# Patient Record
Sex: Male | Born: 1980 | Hispanic: Yes | Marital: Married | Smoking: Never smoker
Health system: Southern US, Community
[De-identification: ages and names within clinical notes are randomized; demographics above are authoritative.]

---

## 2009-07-16 ENCOUNTER — Ambulatory Visit: Payer: Self-pay | Admitting: Internal Medicine

## 2009-07-29 ENCOUNTER — Emergency Department: Payer: Self-pay | Admitting: Emergency Medicine

## 2009-07-30 ENCOUNTER — Ambulatory Visit: Payer: Self-pay | Admitting: Internal Medicine

## 2009-08-15 ENCOUNTER — Ambulatory Visit: Payer: Self-pay | Admitting: Internal Medicine

## 2009-09-15 ENCOUNTER — Ambulatory Visit: Payer: Self-pay | Admitting: Internal Medicine

## 2019-04-24 ENCOUNTER — Emergency Department
Admission: EM | Admit: 2019-04-24 | Discharge: 2019-04-24 | Disposition: A | Discharge status: Home / Self Care | Payer: Self-pay | Attending: Emergency Medicine | Admitting: Emergency Medicine

## 2019-04-24 ENCOUNTER — Other Ambulatory Visit: Payer: Self-pay

## 2019-04-24 ENCOUNTER — Encounter: Payer: Self-pay | Admitting: *Deleted

## 2019-04-24 DIAGNOSIS — R6883 Chills (without fever): Secondary | ICD-10-CM | POA: Insufficient documentation

## 2019-04-24 DIAGNOSIS — R110 Nausea: Secondary | ICD-10-CM | POA: Insufficient documentation

## 2019-04-24 LAB — CBC
HCT: 41.3 % (ref 39.0–52.0)
Hemoglobin: 13.7 g/dL (ref 13.0–17.0)
MCH: 29.4 pg (ref 26.0–34.0)
MCHC: 33.2 g/dL (ref 30.0–36.0)
MCV: 88.6 fL (ref 80.0–100.0)
Platelets: 83 10*3/uL — ABNORMAL LOW (ref 150–400)
RBC: 4.66 MIL/uL (ref 4.22–5.81)
RDW: 12.9 % (ref 11.5–15.5)
WBC: 5.8 10*3/uL (ref 4.0–10.5)
nRBC: 0 % (ref 0.0–0.2)

## 2019-04-24 LAB — COMPREHENSIVE METABOLIC PANEL
ALT: 27 U/L (ref 0–44)
AST: 29 U/L (ref 15–41)
Albumin: 4.4 g/dL (ref 3.5–5.0)
Alkaline Phosphatase: 51 U/L (ref 38–126)
Anion gap: 5 (ref 5–15)
BUN: 18 mg/dL (ref 6–20)
CO2: 29 mmol/L (ref 22–32)
Calcium: 8.9 mg/dL (ref 8.9–10.3)
Chloride: 101 mmol/L (ref 98–111)
Creatinine, Ser: 1.08 mg/dL (ref 0.61–1.24)
GFR calc Af Amer: >60 mL/min (ref >60)
GFR calc non Af Amer: >60 mL/min (ref >60)
Glucose, Bld: 202 mg/dL — ABNORMAL HIGH (ref 70–99)
Potassium: 3.8 mmol/L (ref 3.5–5.1)
Sodium: 135 mmol/L (ref 135–145)
Total Bilirubin: 0.5 mg/dL (ref 0.3–1.2)
Total Protein: 7.4 g/dL (ref 6.5–8.1)

## 2019-04-24 LAB — URINALYSIS, COMPLETE (UACMP) WITH MICROSCOPIC
Bacteria, UA: NONE SEEN
Bilirubin Urine: NEGATIVE
Glucose, UA: 150 mg/dL — AB
Hgb urine dipstick: NEGATIVE
Ketones, ur: NEGATIVE mg/dL
Leukocytes,Ua: NEGATIVE
Nitrite: NEGATIVE
Protein, ur: NEGATIVE mg/dL
Specific Gravity, Urine: 1.006 (ref 1.005–1.030)
Squamous Epithelial / HPF: NONE SEEN (ref 0–5)
pH: 6 (ref 5.0–8.0)

## 2019-04-24 LAB — LIPASE, BLOOD: Lipase: 32 U/L (ref 11–51)

## 2019-04-24 MED ORDER — LACTATED RINGERS IV BOLUS
1000.0000 mL | Freq: Once | INTRAVENOUS | Status: AC
  Administered 2019-04-24: 1000 mL via INTRAVENOUS

## 2019-04-24 MED ORDER — ONDANSETRON HCL 4 MG/2ML IJ SOLN
4.0000 mg | Freq: Once | INTRAMUSCULAR | Status: AC
  Administered 2019-04-24: 4 mg via INTRAVENOUS
  Filled 2019-04-24: qty 2

## 2019-04-24 MED ORDER — ONDANSETRON 4 MG PO TBDP: 4.0000 mg | ORAL_TABLET | Freq: Three times a day (TID) | ORAL | 0 refills | Status: DC | PRN

## 2019-04-24 MED ORDER — SODIUM CHLORIDE 0.9% FLUSH: 3.0000 mL | Freq: Once | INTRAVENOUS | Status: DC

## 2019-04-24 NOTE — ED Provider Notes (Signed)
[facility] Emergency Department Provider Note   ____________________________________________   First MD Initiated Contact with Patient 04/24/19 2010     (approximate)  I have reviewed the triage vital signs and the nursing notes.   HISTORY  Chief Complaint Chills and Nausea    HPI Dale Collins is a 39 y.o. male no significant past history presents to the ED complaining of chills and nausea.  Patient reports that he has been feeling badly for the past 2 days, ever since he spent most of the day working on his car and not drinking much water.  He has had occasional chills along with generalized weakness and nausea.  He denies any associated abdominal pain or diarrhea, has not taken his temperature at home or take any medications for his symptoms.  He has been able to drink Gatorade at times, but has had a hard time tolerating solids.  He denies any sick contacts and has not had any fevers, cough, chest pain, or shortness of breath.        History reviewed. No pertinent past medical history.  There are no problems to display for this patient.   History reviewed. No pertinent surgical history.  Prior to Admission medications   Medication Sig Start Date End Date Taking? Authorizing Provider  ondansetron (ZOFRAN ODT) 4 MG disintegrating tablet Take 1 tablet (4 mg total) by mouth every 8 (eight) hours as needed for nausea or vomiting. 04/24/19   Chesley Noon, MD    Allergies Omnicef [cefdinir]  No family history on file.  Social History Social History   Tobacco Use  . Smoking status: Never Smoker  . Smokeless tobacco: Never Used  Substance Use Topics  . Alcohol use: Not Currently  . Drug use: Not Currently    Review of Systems  Constitutional: Negative for fever, positive for chills. Eyes: No visual changes. ENT: No sore throat. Cardiovascular: Denies chest pain. Respiratory: Denies shortness of breath. Gastrointestinal: No abdominal  pain.  Positive for nausea, no vomiting.  No diarrhea.  No constipation. Genitourinary: Negative for dysuria. Musculoskeletal: Negative for back pain. Skin: Negative for rash. Neurological: Negative for headaches, focal weakness or numbness.  ____________________________________________   PHYSICAL EXAM:  VITAL SIGNS: ED Triage Vitals [04/24/19 1950]  Enc Vitals Group     BP (!) 140/59     Pulse Rate 77     Resp 18     Temp 97.9 F (36.6 C)     Temp Source Oral     SpO2 97 %     Weight 175 lb (79.4 kg)     Height 5\' 11"  (1.803 m)     Head Circumference      Peak Flow      Pain Score      Pain Loc      Pain Edu?      Excl. in GC?     Constitutional: Alert and oriented. Eyes: Conjunctivae are normal. Head: Atraumatic. Nose: No congestion/rhinnorhea. Mouth/Throat: Mucous membranes are moist. Neck: Normal ROM Cardiovascular: Normal rate, regular rhythm. Grossly normal heart sounds. Respiratory: Normal respiratory effort.  No retractions. Lungs CTAB. Gastrointestinal: Soft and nontender. No distention. Genitourinary: deferred Musculoskeletal: No lower extremity tenderness nor edema. Neurologic:  Normal speech and language. No gross focal neurologic deficits are appreciated. Skin:  Skin is warm, dry and intact. No rash noted. Psychiatric: Mood and affect are normal. Speech and behavior are normal.  ____________________________________________   LABS (all labs ordered are listed, but only abnormal  results are displayed)  Labs Reviewed  COMPREHENSIVE METABOLIC PANEL - Abnormal; Notable for the following components:      Result Value   Glucose, Bld 202 (*)    All other components within normal limits  CBC - Abnormal; Notable for the following components:   Platelets 83 (*)    All other components within normal limits  URINALYSIS, COMPLETE (UACMP) WITH MICROSCOPIC - Abnormal; Notable for the following components:   Color, Urine STRAW (*)    APPearance CLEAR (*)     Glucose, UA 150 (*)    All other components within normal limits  LIPASE, BLOOD    PROCEDURES  Procedure(s) performed (including Critical Care):  Procedures   ____________________________________________   INITIAL IMPRESSION / ASSESSMENT AND PLAN / ED COURSE       39 year old male presents to the ED with 2 days of chills and nausea, but denies any abdominal pain or diarrhea and has not had any other symptoms concerning for infection.  He has a benign abdominal exam here in the ED and lab work thus far is reassuring, including LFTs and lipase.  I suspect there is an element of dehydration as he spent much of the day working on his car water.  Will hydrate with IV fluids and treat with Zofran, also check urinalysis and reassess.  UA is unremarkable, patient is feeling better following IV fluid bolus Zofran.  He is tolerating p.o. without difficulty and is appropriate for discharge home.  Will prescribe Zofran for home use and patient provided with referral to PCP.  He was counseled to return to the ED for worsening symptoms, patient agrees with plan.      ____________________________________________   FINAL CLINICAL IMPRESSION(S) / ED DIAGNOSES  Final diagnoses:  Nausea  Chills     ED Discharge Orders         Ordered    ondansetron (ZOFRAN ODT) 4 MG disintegrating tablet  Every 8 hours PRN     04/24/19 2222           Note:  This document was prepared using Dragon voice recognition software and may include unintentional dictation errors.   Blake Divine, MD 04/25/19 [phone]

## 2019-04-24 NOTE — ED Triage Notes (Signed)
Pt feels nauseated and weak.  Pt reports chills.  Sx began yesterday.  No cough.  No sob   No chest pain.  Pt alert speech clear.

## 2019-04-24 NOTE — ED Notes (Signed)
Patient is feeling good, wants to go home

## 2019-04-24 NOTE — ED Notes (Signed)
Pt ambulated to restroom; pt returned to bed. No other needs voiced at this time.

## 2019-06-21 ENCOUNTER — Other Ambulatory Visit: Payer: Self-pay

## 2019-06-21 ENCOUNTER — Encounter: Payer: Self-pay | Admitting: *Deleted

## 2019-06-21 DIAGNOSIS — R1010 Upper abdominal pain, unspecified: Secondary | ICD-10-CM | POA: Insufficient documentation

## 2019-06-21 LAB — COMPREHENSIVE METABOLIC PANEL WITH GFR
ALT: 26 U/L (ref 0–44)
AST: 28 U/L (ref 15–41)
Albumin: 5.2 g/dL — ABNORMAL HIGH (ref 3.5–5.0)
Alkaline Phosphatase: 86 U/L (ref 38–126)
Anion gap: 10 (ref 5–15)
BUN: 14 mg/dL (ref 6–20)
CO2: 22 mmol/L (ref 22–32)
Calcium: 9.7 mg/dL (ref 8.9–10.3)
Chloride: 106 mmol/L (ref 98–111)
Creatinine, Ser: 1.26 mg/dL — ABNORMAL HIGH (ref 0.61–1.24)
GFR calc Af Amer: >60 mL/min
GFR calc non Af Amer: >60 mL/min
Glucose, Bld: 115 mg/dL — ABNORMAL HIGH (ref 70–99)
Potassium: 4.6 mmol/L (ref 3.5–5.1)
Sodium: 138 mmol/L (ref 135–145)
Total Bilirubin: 0.8 mg/dL (ref 0.3–1.2)
Total Protein: 9 g/dL — ABNORMAL HIGH (ref 6.5–8.1)

## 2019-06-21 LAB — CBC
HCT: 51.3 % (ref 39.0–52.0)
Hemoglobin: 17.3 g/dL — ABNORMAL HIGH (ref 13.0–17.0)
MCH: 29.7 pg (ref 26.0–34.0)
MCHC: 33.7 g/dL (ref 30.0–36.0)
MCV: 88 fL (ref 80.0–100.0)
Platelets: 100 K/uL — ABNORMAL LOW (ref 150–400)
RBC: 5.83 MIL/uL — ABNORMAL HIGH (ref 4.22–5.81)
RDW: 13 % (ref 11.5–15.5)
WBC: 7.4 K/uL (ref 4.0–10.5)
nRBC: 0 % (ref 0.0–0.2)

## 2019-06-21 LAB — LIPASE, BLOOD: Lipase: 33 U/L (ref 11–51)

## 2019-06-21 MED ORDER — SODIUM CHLORIDE 0.9% FLUSH: 3.0000 mL | Freq: Once | INTRAVENOUS | Status: DC

## 2019-06-21 NOTE — ED Triage Notes (Signed)
Pt has abd pain for 3 days.   Nausea and diarrhea. No vomiting.  Pt also reports lower back pain.  Pt alert.

## 2019-06-21 NOTE — ED Notes (Signed)
Unable to void at this time.

## 2019-06-22 ENCOUNTER — Emergency Department
Admission: EM | Admit: 2019-06-22 | Discharge: 2019-06-22 | Disposition: A | Discharge status: Home / Self Care | Payer: Self-pay | Attending: Emergency Medicine | Admitting: Emergency Medicine

## 2019-06-22 ENCOUNTER — Emergency Department: Payer: Self-pay

## 2019-06-22 DIAGNOSIS — R1010 Upper abdominal pain, unspecified: Secondary | ICD-10-CM

## 2019-06-22 LAB — URINALYSIS, COMPLETE (UACMP) WITH MICROSCOPIC
Bacteria, UA: NONE SEEN
Bilirubin Urine: NEGATIVE
Glucose, UA: NEGATIVE mg/dL
Ketones, ur: 5 mg/dL — AB
Leukocytes,Ua: NEGATIVE
Nitrite: NEGATIVE
Protein, ur: NEGATIVE mg/dL
Specific Gravity, Urine: 1.01 (ref 1.005–1.030)
Squamous Epithelial / HPF: NONE SEEN (ref 0–5)
pH: 5 (ref 5.0–8.0)

## 2019-06-22 MED ORDER — ONDANSETRON HCL 4 MG/2ML IJ SOLN
4.0000 mg | INTRAMUSCULAR | Status: AC
  Administered 2019-06-22: 06:00:00 4 mg via INTRAVENOUS
  Filled 2019-06-22: qty 2

## 2019-06-22 MED ORDER — MORPHINE SULFATE (PF) 4 MG/ML IV SOLN
4.0000 mg | Freq: Once | INTRAVENOUS | Status: AC
  Administered 2019-06-22: 06:00:00 4 mg via INTRAVENOUS
  Filled 2019-06-22: qty 1

## 2019-06-22 MED ORDER — ONDANSETRON 4 MG PO TBDP: ORAL_TABLET | ORAL | 0 refills | Status: AC

## 2019-06-22 MED ORDER — OMEPRAZOLE MAGNESIUM 20 MG PO TBEC: 20.0000 mg | DELAYED_RELEASE_TABLET | Freq: Every day | ORAL | 1 refills | Status: AC

## 2019-06-22 MED ORDER — IOHEXOL 300 MG/ML  SOLN
100.0000 mL | Freq: Once | INTRAMUSCULAR | Status: AC | PRN
  Administered 2019-06-22: 100 mL via INTRAVENOUS

## 2019-06-22 MED ORDER — SODIUM CHLORIDE 0.9 % IV BOLUS
1000.0000 mL | Freq: Once | INTRAVENOUS | Status: AC
  Administered 2019-06-22: 06:00:00 1000 mL via INTRAVENOUS

## 2019-06-22 NOTE — ED Provider Notes (Signed)
[facility] Emergency Department Provider Note  ____________________________________________   First MD Initiated Contact with Patient 06/22/19 0448     (approximate)  I have reviewed the triage vital signs and the nursing notes.   HISTORY  Chief Complaint Abdominal Pain    HPI Dale Collins is a 39 y.o. male with no chronic medical issues who presents for evaluation of worsening upper abdominal pain for the last 3 days.  He said it first started after he had something to eat and it lasted a few hours and then went away.  But the pain has gotten more intense and more constant over the last couple of days and he is now unable to eat or drink anything due to the pain and the nausea that starts when he eats.  The pain is in the upper part of his abdomen and nonradiating  and he describes it as both sharp and aching.  He has no lower abdominal pain, no dysuria, and he has had some chills as result of the pain but no known fever.  He denies chest pain, shortness of breath, cough, and sore throat.  No testicular pain.  No history of gallbladder disease or kidney stones.  The pain is severe.        History reviewed. No pertinent past medical history.  There are no problems to display for this patient.   No past surgical history on file.  Prior to Admission medications   Medication Sig Start Date End Date Taking? Authorizing Provider  omeprazole (PRILOSEC OTC) 20 MG tablet Take 1 tablet (20 mg total) by mouth daily. 06/22/19 06/21/20  Loleta Rose, MD  ondansetron (ZOFRAN ODT) 4 MG disintegrating tablet Allow 1-2 tablets to dissolve in your mouth every 8 hours as needed for nausea/vomiting 06/22/19   Loleta Rose, MD    Allergies Omnicef [cefdinir]  No family history on file.  Social History Social History   Tobacco Use  . Smoking status: Never Smoker  . Smokeless tobacco: Never Used  Substance Use Topics  . Alcohol use: Not Currently  . Drug use: Not  Currently    Review of Systems Constitutional: No fever/chills Eyes: No visual changes. ENT: No sore throat. Cardiovascular: Denies chest pain. Respiratory: Denies shortness of breath. Gastrointestinal: Upper abdominal pain with nausea worse after eating x3 days. Genitourinary: Negative for dysuria.  Negative for hematuria. Musculoskeletal: Negative for neck pain.  Negative for back pain. Integumentary: Negative for rash. Neurological: Negative for headaches, focal weakness or numbness.   ____________________________________________   PHYSICAL EXAM:  VITAL SIGNS: ED Triage Vitals  Enc Vitals Group     BP 06/22/19 0508 136/83     Pulse Rate 06/21/19 2251 87     Resp 06/21/19 2251 18     Temp 06/21/19 2251 98 F (36.7 C)     Temp Source 06/21/19 2251 Oral     SpO2 06/21/19 2251 99 %     Weight 06/21/19 2252 77.1 kg (170 lb)     Height 06/21/19 2252 1.803 m (5\' 11" )     Head Circumference --      Peak Flow --      Pain Score 06/21/19 2252 9     Pain Loc --      Pain Edu? --      Excl. in GC? --     Constitutional: Alert and oriented.  The patient appears very uncomfortable and appears to be in pain. Eyes: Conjunctivae are normal.  Head: Atraumatic. Nose:  No congestion/rhinnorhea. Mouth/Throat: Patient is wearing a mask. Neck: No stridor.  No meningeal signs.   Cardiovascular: Mild tachycardia, regular rhythm. Good peripheral circulation. Grossly normal heart sounds. Respiratory: Normal respiratory effort.  No retractions. Gastrointestinal: Soft and nondistended.  No lower abdominal tenderness including negative Rovsing sign and no tenderness at McBurney's point.  He has tenderness to palpation of the epigastrium and right upper quadrant with equivocal Murphy sign but the epigastrium in particular is exquisitely tender. Musculoskeletal: No lower extremity tenderness nor edema. No gross deformities of extremities. Neurologic:  Normal speech and language. No gross focal  neurologic deficits are appreciated.  Skin:  Skin is warm, dry and intact. Psychiatric: Mood and affect are normal. Speech and behavior are normal.  ____________________________________________   LABS (all labs ordered are listed, but only abnormal results are displayed)  Labs Reviewed  COMPREHENSIVE METABOLIC PANEL - Abnormal; Notable for the following components:      Result Value   Glucose, Bld 115 (*)    Creatinine, Ser 1.26 (*)    Total Protein 9.0 (*)    Albumin 5.2 (*)    All other components within normal limits  CBC - Abnormal; Notable for the following components:   RBC 5.83 (*)    Hemoglobin 17.3 (*)    Platelets 100 (*)    All other components within normal limits  URINALYSIS, COMPLETE (UACMP) WITH MICROSCOPIC - Abnormal; Notable for the following components:   Color, Urine YELLOW (*)    APPearance CLEAR (*)    Hgb urine dipstick SMALL (*)    Ketones, ur 5 (*)    All other components within normal limits  LIPASE, BLOOD   ____________________________________________  EKG  None - EKG not ordered by ED physician ____________________________________________  RADIOLOGY Marylou Mccoy, personally viewed and evaluated these images (plain radiographs) as part of my medical decision making, as well as reviewing the written report by the radiologist.  ED MD interpretation: No acute abnormalities identified on CT nor ultrasound.  Mild fatty liver disease.  Official radiology report(s): CT ABDOMEN PELVIS W CONTRAST  Result Date: 06/22/2019 CLINICAL DATA:  Epigastric pain for 3 days. Nausea and vomiting. EXAM: CT ABDOMEN AND PELVIS WITH CONTRAST TECHNIQUE: Multidetector CT imaging of the abdomen and pelvis was performed using the standard protocol following bolus administration of intravenous contrast. CONTRAST:  OMNIPAQUE IOHEXOL 300 MG/ML  SOLN COMPARISON:  Abdominal ultrasound 04/22/2019 FINDINGS: Lower chest: The lung bases are clear without focal nodule, mass,  or airspace disease. The heart size is normal. No significant pleural or pericardial effusion is present. Hepatobiliary: Mild fatty infiltration of the liver is noted. No focal lesions are present. The common bile duct and gallbladder are normal. Pancreas: Unremarkable. No pancreatic ductal dilatation or surrounding inflammatory changes. Spleen: Normal in size without focal abnormality. Adrenals/Urinary Tract: Adrenal glands are normal bilaterally. Kidneys and ureters are within normal limits. No stone or mass lesion is present. The urinary bladder is within normal limits. Stomach/Bowel: Stomach and duodenum are within normal limits. The small bowel is normal. Terminal ileum is within normal limits. The appendix is visualized and normal. The ascending and transverse colon are within normal limits. Descending and sigmoid colon are normal. Vascular/Lymphatic: No significant vascular findings are present. No enlarged abdominal or pelvic lymph nodes. Reproductive: Prostate is unremarkable. Other: No abdominal wall hernia or abnormality. No abdominopelvic ascites. Musculoskeletal: No acute or significant osseous findings. IMPRESSION: 1. No acute or focal lesion to explain the patient's symptoms. 2. Mild fatty infiltration  of the liver. Electronically Signed   By: Marin Roberts M.D.   On: 06/22/2019 06:52   US ABDOMEN LIMITED RUQ  Result Date: 06/22/2019 CLINICAL DATA:  Upper abdominal pain with nausea, worse with eating EXAM: ULTRASOUND ABDOMEN LIMITED RIGHT UPPER QUADRANT COMPARISON:  Ultrasound 07/29/2009 FINDINGS: Gallbladder: No gallstones or wall thickening visualized. No sonographic Murphy sign noted by sonographer. Common bile duct: Diameter: 4 mm, nondilated Liver: Mildly increased parenchymal echogenicity with some diminished through transmission and loss of definition of the portal triads. Geographic region of sparing is seen along the gallbladder fossa. Portal vein is patent on color Doppler imaging  with normal direction of blood flow towards the liver. Other: None. IMPRESSION: Diffusely increased hepatic echogenicity most often seen with hepatic steatosis with some focal fatty sparing along the gallbladder fossa. Otherwise unremarkable right upper quadrant ultrasound. Electronically Signed   By: Kreg Shropshire M.D.   On: 06/22/2019 06:14    ____________________________________________   PROCEDURES   Procedure(s) performed (including Critical Care):  Procedures   ____________________________________________   INITIAL IMPRESSION / MDM / ASSESSMENT AND PLAN / ED COURSE  As part of my medical decision making, I reviewed the following data within the electronic MEDICAL RECORD NUMBER Nursing notes reviewed and incorporated, Labs reviewed , Old chart reviewed, Notes from prior ED visits and [place] Controlled Substance Database   Differential diagnosis includes, but is not limited to, biliary colic, cholecystitis, gastritis, gastric ulcer, less likely appendicitis although a retrocecal appendicitis is possible.  The patient's vital signs are reassuring although he has developed some mild tachycardia during 7 hours of waiting for an exam bed.  His lab work is notable for no leukocytosis and no LFT elevation.  He has a slightly elevated creatinine and some ketonuria which I suspect is due to decreased oral intake over the last couple of days.  I have ordered morphine 4 mg IV, Zofran 4 mg IV, and 1 L normal saline IV bolus for what appears to be volume depletion.  Right upper quadrant ultrasound has been ordered I will reassess after the ultrasound results are back.  He agrees with the plan.       Clinical Course as of Jun 22 798  Fri Jun 22, 2019  0617 unremarkable U/S, will proceed with CT abd/pelvis given no clear diagnosis at this time  US ABDOMEN LIMITED RUQ [CF]  0735 CT scan is also normal with no clear indication of an emergent medical condition.  I reassessed the patient and he said that  he is feeling better and no longer has pain.  I explained that we do not have a specific cause for symptoms but there is no evidence of an emergent condition at this time.  I provided prescriptions as listed below and recommended follow-up with GI.  I gave my usual and customary return precautions.   [CF]    Clinical Course User Index [CF] Loleta Rose, MD     ____________________________________________  FINAL CLINICAL IMPRESSION(S) / ED DIAGNOSES  Final diagnoses:  Pain of upper abdomen     MEDICATIONS GIVEN DURING THIS VISIT:  Medications  sodium chloride flush (NS) 0.9 % injection 3 mL (has no administration in time range)  sodium chloride 0.9 % bolus 1,000 mL (1,000 mLs Intravenous New Bag/Given 06/22/19 0541)  morphine 4 MG/ML injection 4 mg (4 mg Intravenous Given 06/22/19 0539)  ondansetron (ZOFRAN) injection 4 mg (4 mg Intravenous Given 06/22/19 0539)  iohexol (OMNIPAQUE) 300 MG/ML solution 100 mL (100 mLs Intravenous Contrast  Given 06/22/19 4010)     ED Discharge Orders         Ordered    ondansetron (ZOFRAN ODT) 4 MG disintegrating tablet     06/22/19 0736    omeprazole (PRILOSEC OTC) 20 MG tablet  Daily     06/22/19 0736          *Please note:  Jancarlo Biermann was evaluated in Emergency Department on 06/22/2019 for the symptoms described in the history of present illness. He was evaluated in the context of the global COVID-19 pandemic, which necessitated consideration that the patient might be at risk for infection with the SARS-CoV-2 virus that causes COVID-19. Institutional protocols and algorithms that pertain to the evaluation of patients at risk for COVID-19 are in a state of rapid change based on information released by regulatory bodies including the CDC and federal and state organizations. These policies and algorithms were followed during the patient's care in the ED.  Some ED evaluations and interventions may be delayed as a result of limited staffing during the  pandemic.*  Note:  This document was prepared using Dragon voice recognition software and may include unintentional dictation errors.   Hinda Kehr, MD 06/22/19 0800

## 2019-06-22 NOTE — Discharge Instructions (Signed)

## 2019-08-28 ENCOUNTER — Telehealth: Payer: Self-pay | Admitting: General Practice

## 2019-08-28 NOTE — Telephone Encounter (Signed)
Individual has been contacted 3+ times regarding ED referral. No further attempts to contact individual will be made. 

## 2022-01-07 IMAGING — CT CT ABD-PELV W/ CM
2 of 5 series · 16 of 46 positions shown, 18 images · IV contrast (APPLIED)
Comparison: Abdominal ultrasound 04/22/2019

CLINICAL DATA: Epigastric pain for 3 days. Nausea and vomiting.

EXAM:
CT ABDOMEN AND PELVIS WITH CONTRAST
TECHNIQUE: Multidetector CT imaging of the abdomen and pelvis was performed
using the standard protocol following bolus administration of
intravenous contrast.
CONTRAST:  100mL OMNIPAQUE IOHEXOL 300 MG/ML  SOLN

[Series 2: routine abd/pel with · axial · 0.75mm/px · z∈[-843,-448]mm · 13 of 89 slices shown, 15 images]
[im 5/89  soft-tissue]
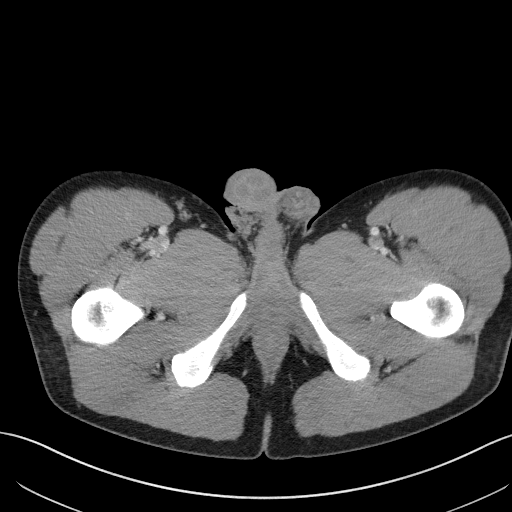
[im 5/89  bone]
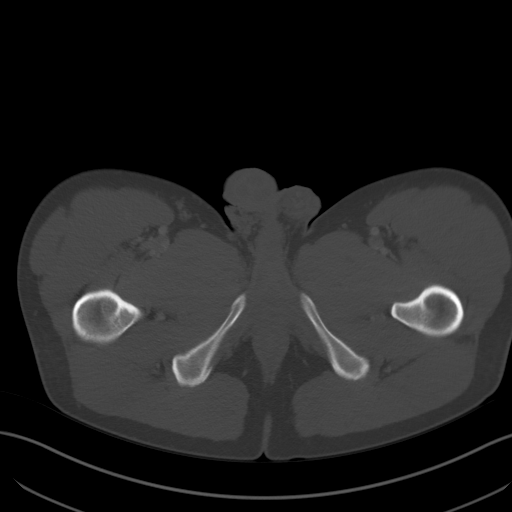
[im 14/89  soft-tissue]
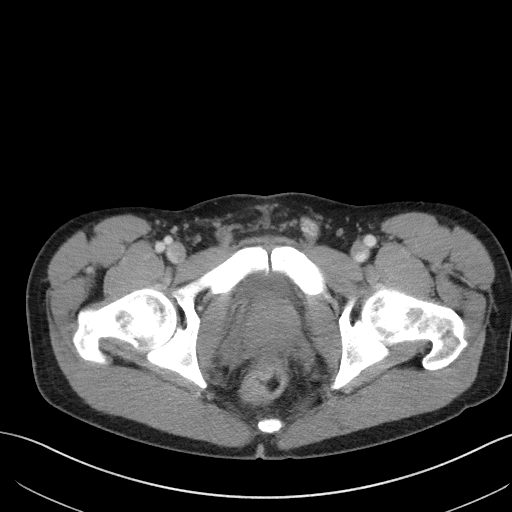
[im 19/89  soft-tissue]
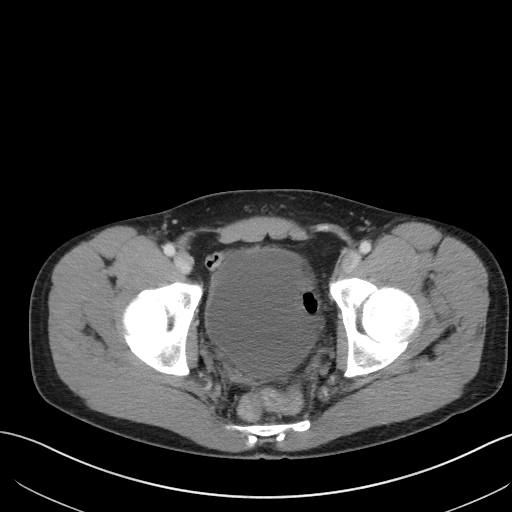
[im 24/89  soft-tissue]
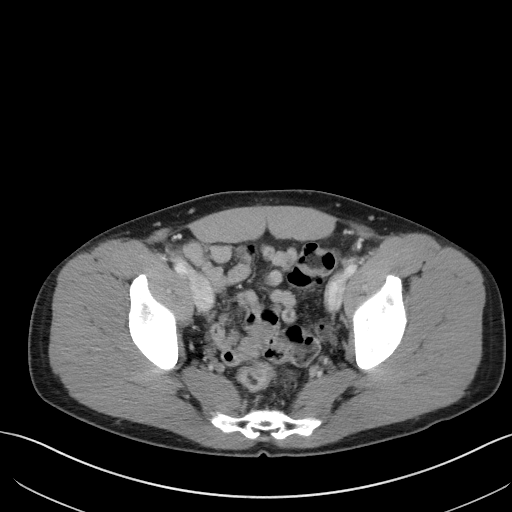
[im 33/89  soft-tissue]
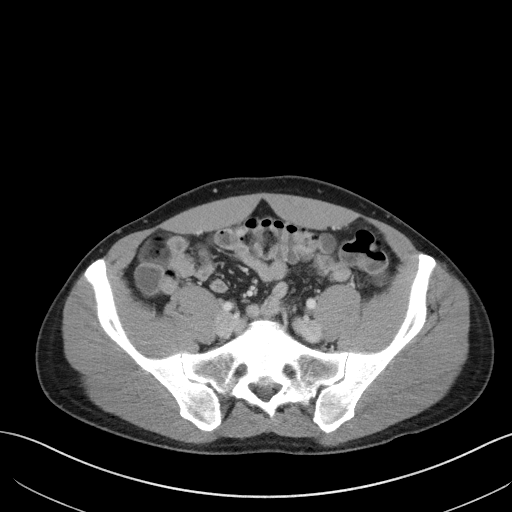
[im 38/89  soft-tissue]
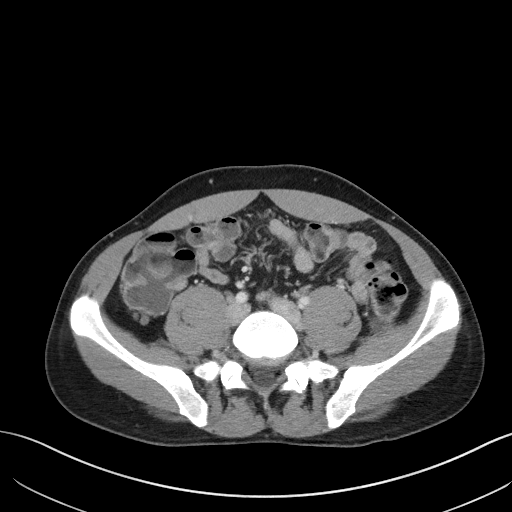
[im 47/89  soft-tissue]
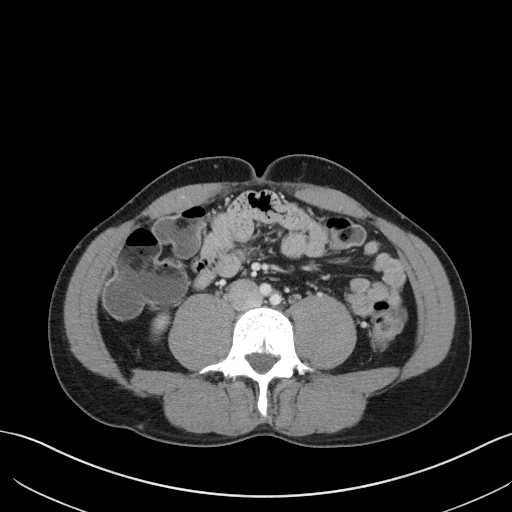
[im 51/89  soft-tissue]
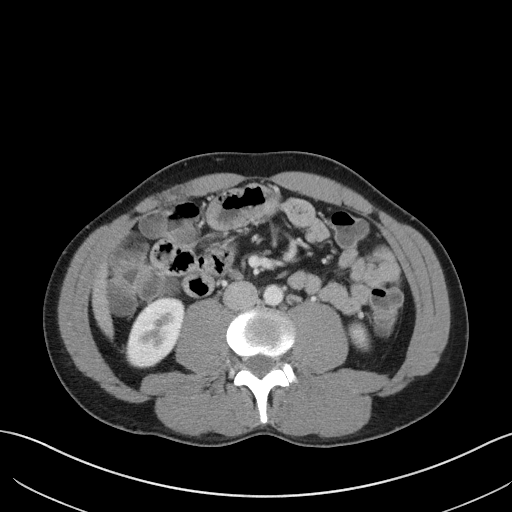
[im 56/89  soft-tissue]
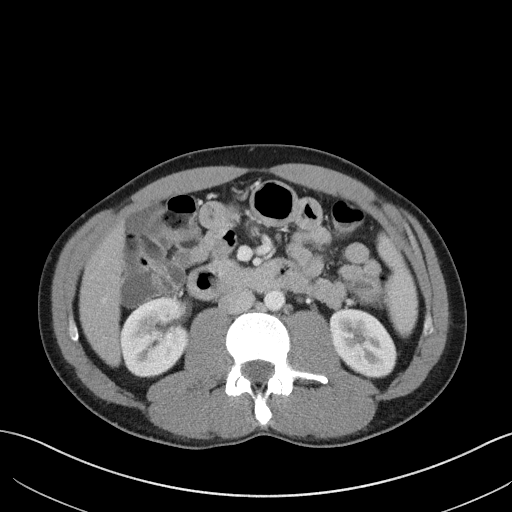
[im 56/89  bone]
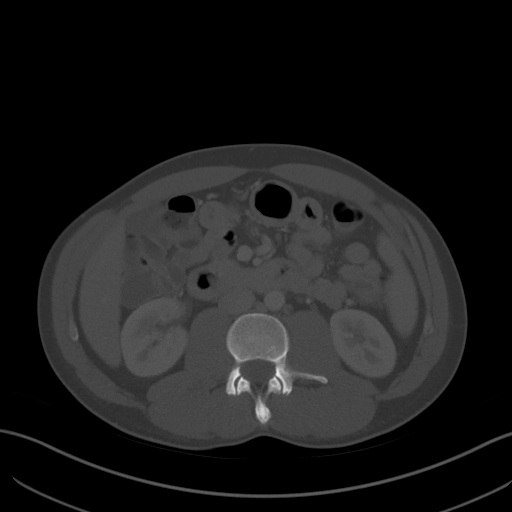
[im 65/89  soft-tissue]
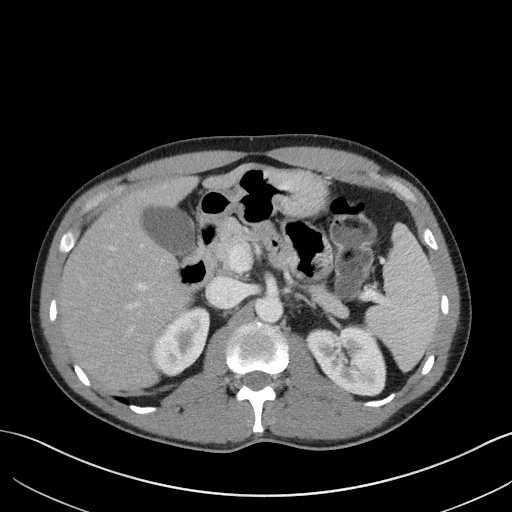
[im 70/89  soft-tissue]
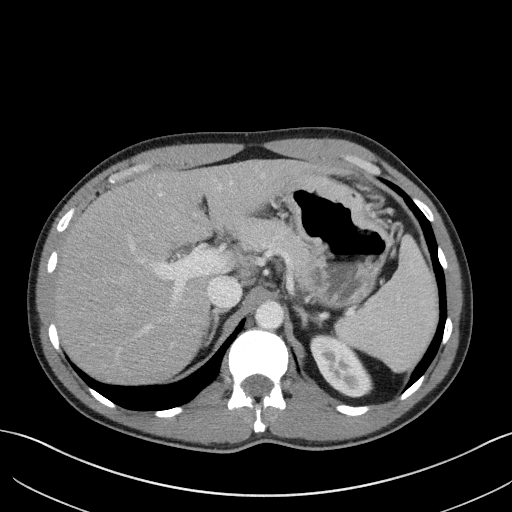
[im 75/89  soft-tissue]
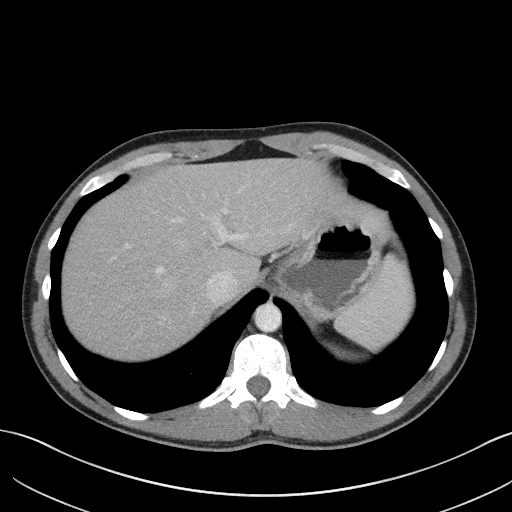
[im 84/89  soft-tissue]
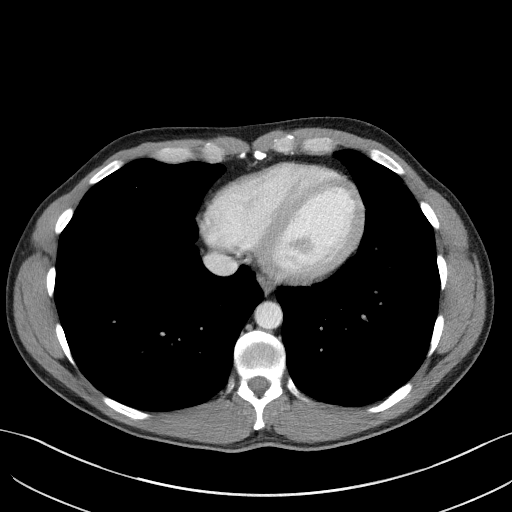

[Series 5: coronal st · coronal · 0.71mm/px · 3 of 76 slices shown]
[im 26/76  soft-tissue]
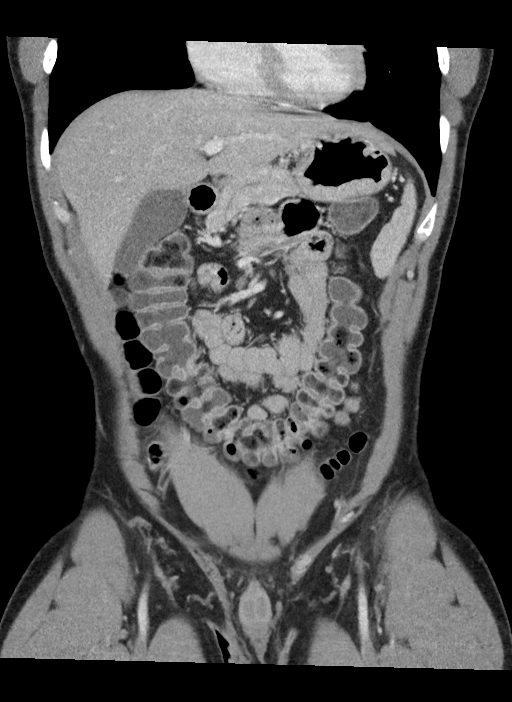
[im 34/76  soft-tissue]
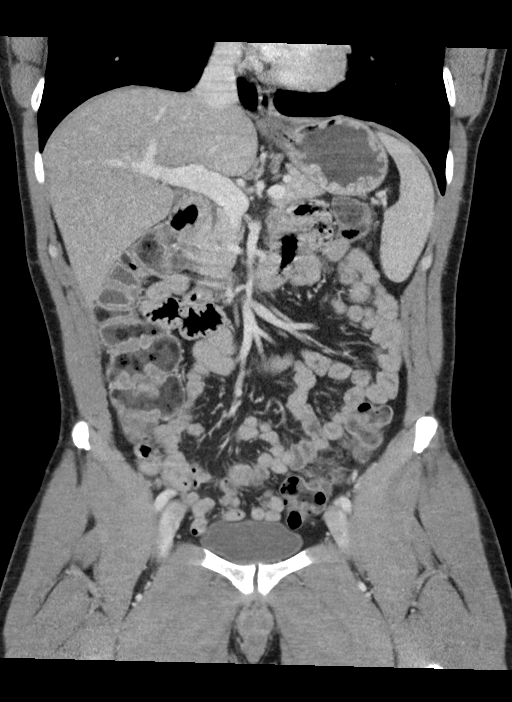
[im 42/76  soft-tissue]
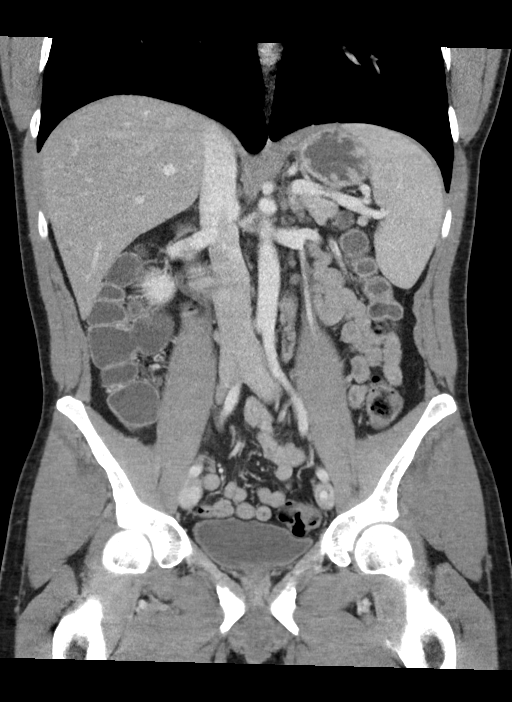

[16 of 46 positions shown; findings below may reference images not displayed]

FINDINGS: Lower chest: The lung bases are clear without focal nodule, mass, or
airspace disease. The heart size is normal. No significant pleural
or pericardial effusion is present.

Hepatobiliary: Mild fatty infiltration of the liver is noted. No
focal lesions are present. The common bile duct and gallbladder are
normal.

Pancreas: Unremarkable. No pancreatic ductal dilatation or
surrounding inflammatory changes.

Spleen: Normal in size without focal abnormality.

Adrenals/Urinary Tract: Adrenal glands are normal bilaterally.
Kidneys and ureters are within normal limits. No stone or mass
lesion is present. The urinary bladder is within normal limits.

Stomach/Bowel: Stomach and duodenum are within normal limits. The
small bowel is normal. Terminal ileum is within normal limits. The
appendix is visualized and normal. The ascending and transverse
colon are within normal limits. Descending and sigmoid colon are
normal.

Vascular/Lymphatic: No significant vascular findings are present. No
enlarged abdominal or pelvic lymph nodes.

Reproductive: Prostate is unremarkable.

Other: No abdominal wall hernia or abnormality. No abdominopelvic
ascites.

Musculoskeletal: No acute or significant osseous findings.
IMPRESSION: 1. No acute or focal lesion to explain the patient's symptoms.
2. Mild fatty infiltration of the liver.

## 2022-01-07 IMAGING — US US ABDOMEN LIMITED
1 series · 14 of 25 positions shown · non-contrast
Comparison: Ultrasound 07/29/2009

CLINICAL DATA: Upper abdominal pain with nausea, worse with eating

EXAM:
ULTRASOUND ABDOMEN LIMITED RIGHT UPPER QUADRANT

[Series 1: us abdomen limited ruq · 14 of 42 slices shown]
[im 1/42]
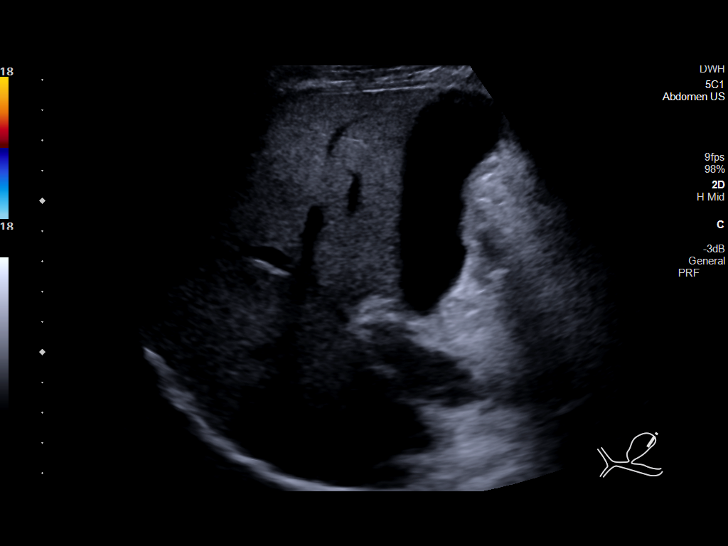
[im 4/42]
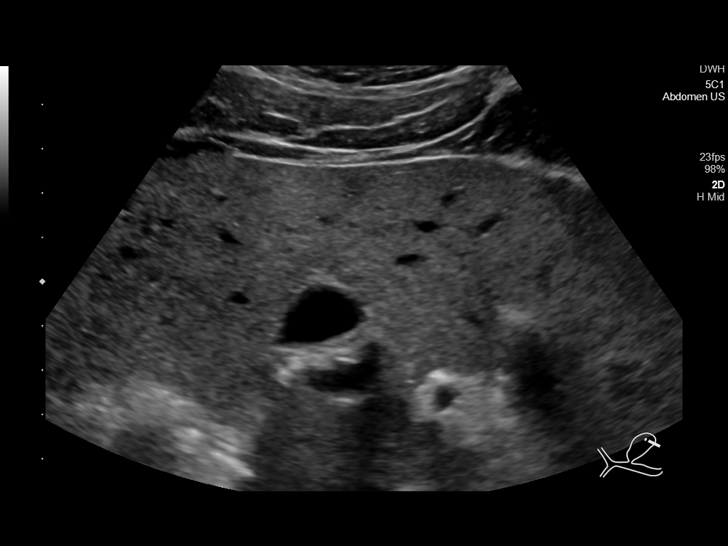
[im 7/42]
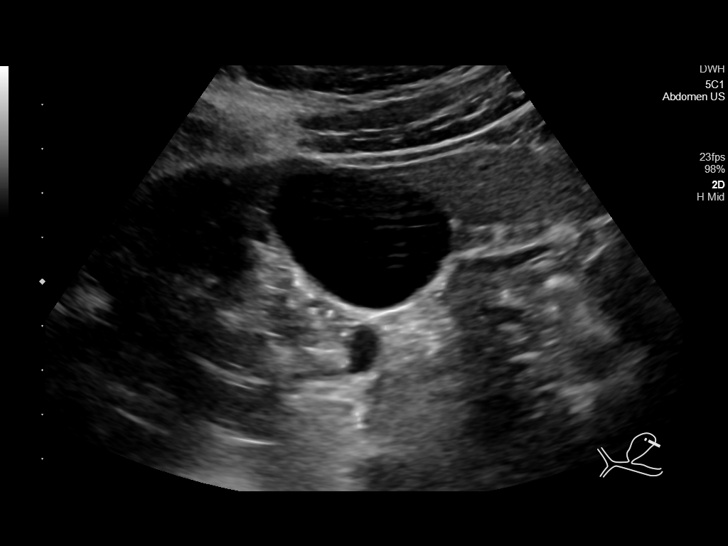
[im 11/42]
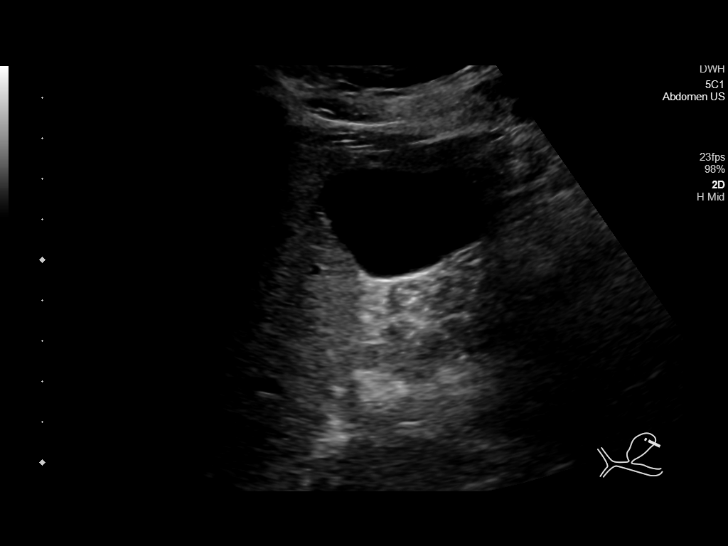
[im 14/42]
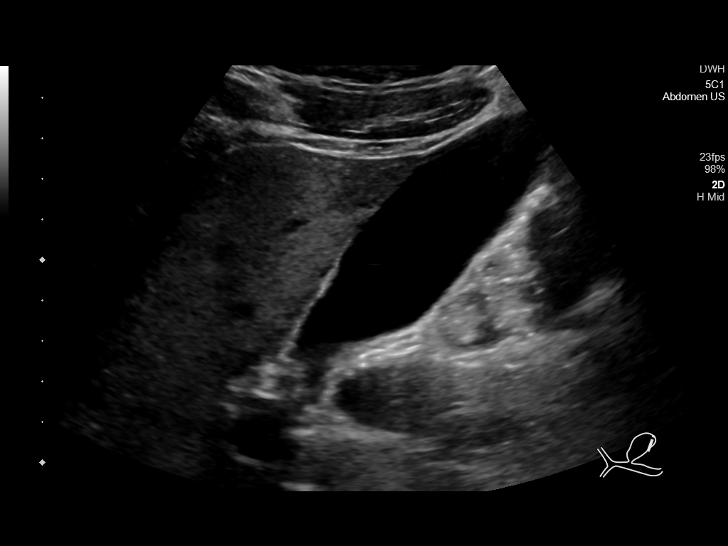
[im 16/42]
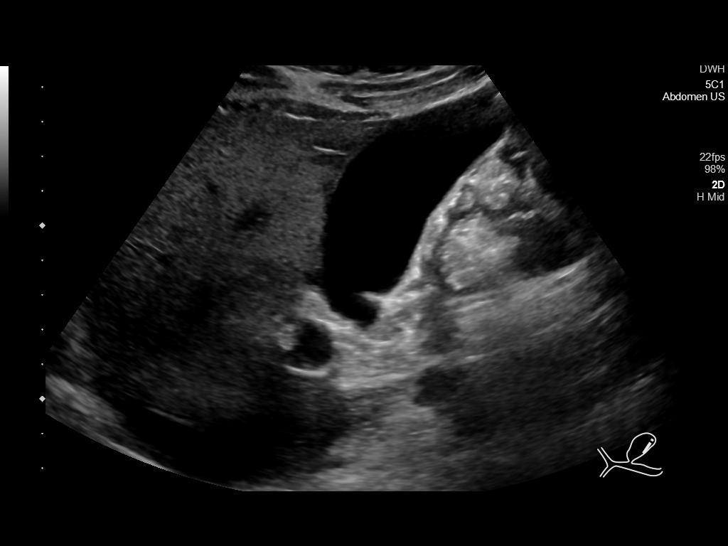
[im 19/42]
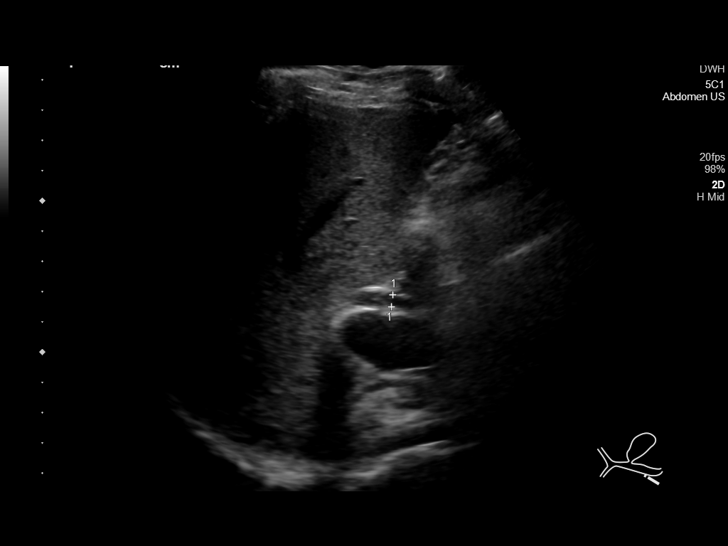
[im 23/42]
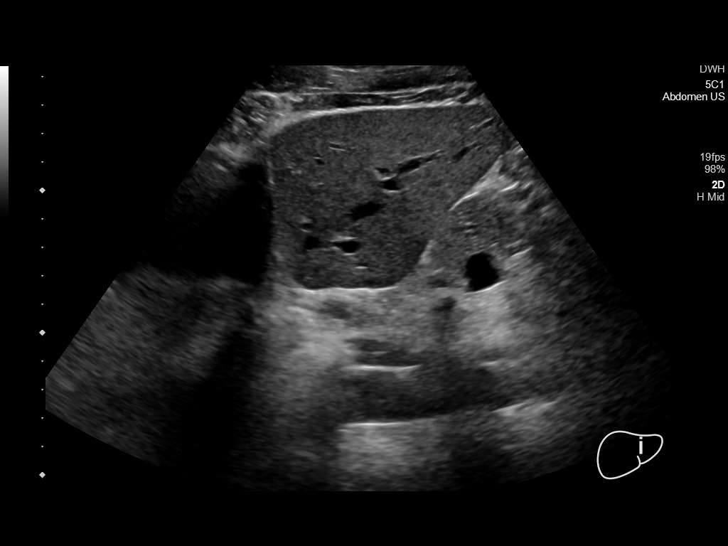
[im 26/42]
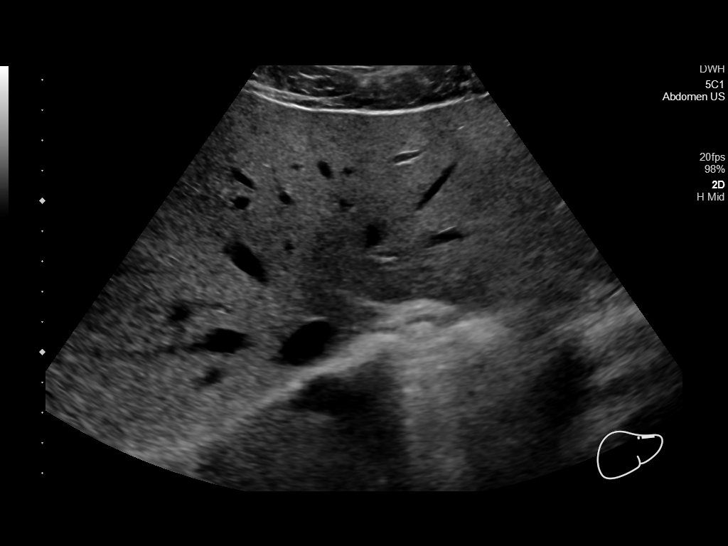
[im 28/42]
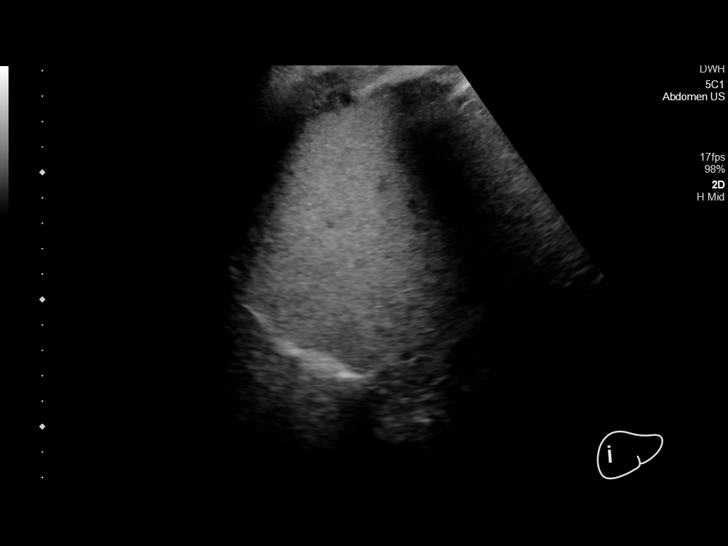
[im 31/42]
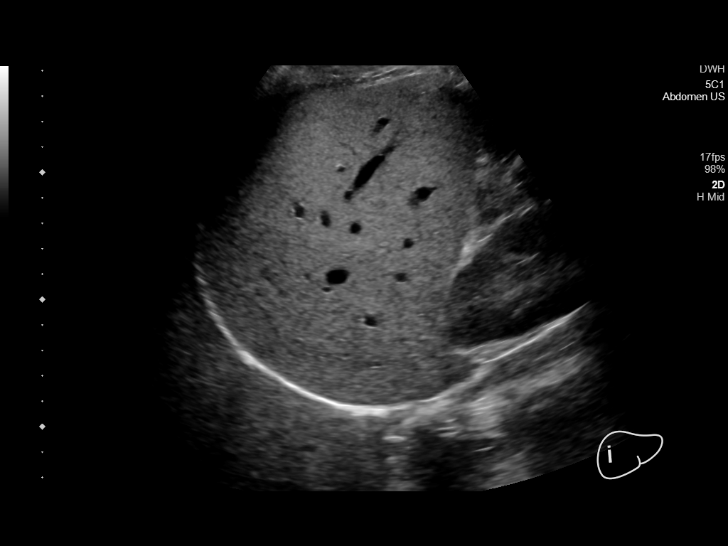
[im 35/42]
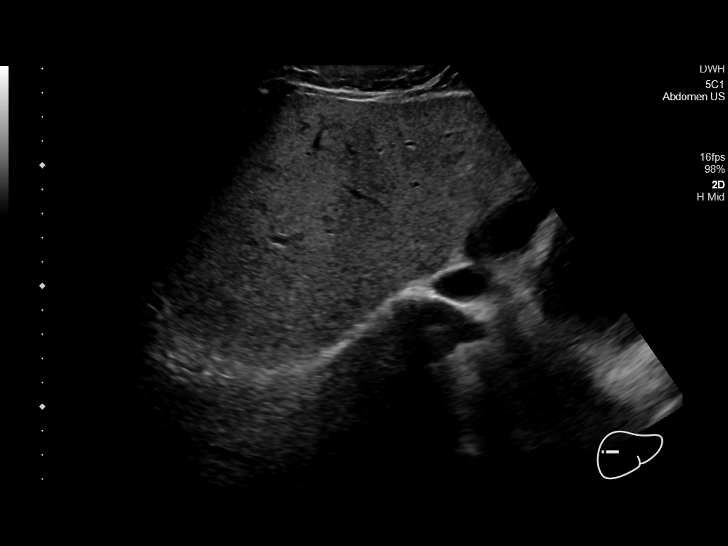
[im 38/42]
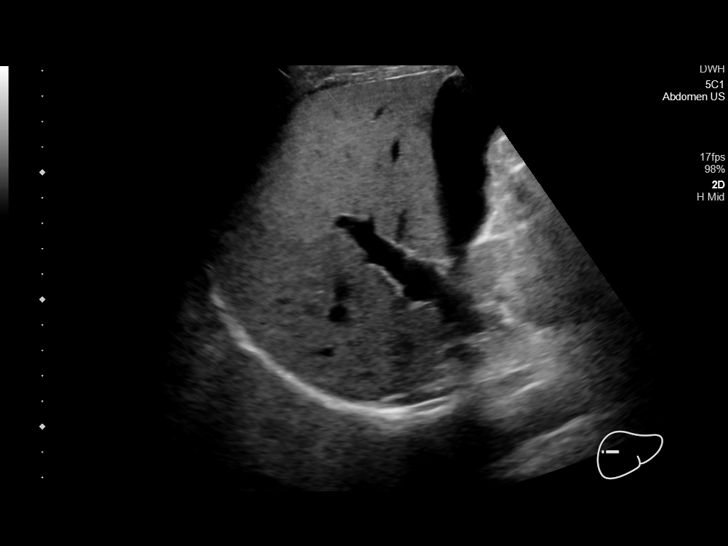
[im 42/42]
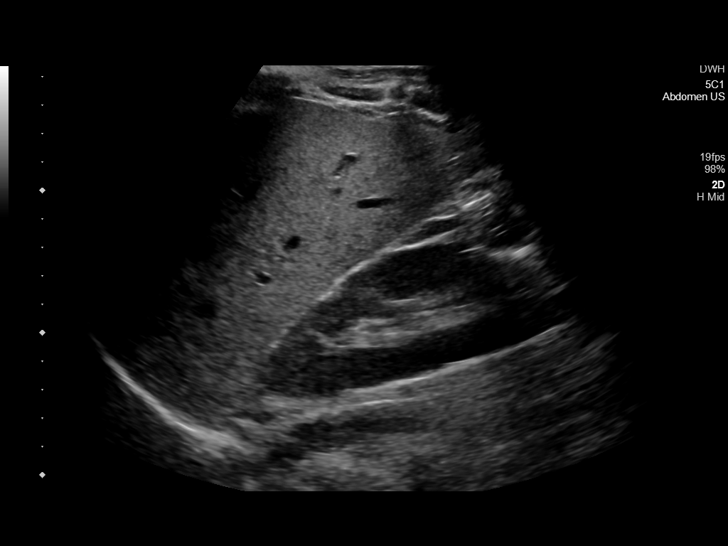

[14 of 25 positions shown; findings below may reference images not displayed]

FINDINGS: Gallbladder:

No gallstones or wall thickening visualized. No sonographic Murphy
sign noted by sonographer.

Common bile duct:

Diameter: 4 mm, nondilated

Liver:

Mildly increased parenchymal echogenicity with some diminished
through transmission and loss of definition of the portal triads.
Geographic region of sparing is seen along the gallbladder fossa.
Portal vein is patent on color Doppler imaging with normal direction
of blood flow towards the liver.

Other: None.
IMPRESSION: Diffusely increased hepatic echogenicity most often seen with
hepatic steatosis with some focal fatty sparing along the
gallbladder fossa.

Otherwise unremarkable right upper quadrant ultrasound.

## 2022-09-20 ENCOUNTER — Ambulatory Visit: Payer: 59

## 2022-09-20 DIAGNOSIS — R1319 Other dysphagia: Secondary | ICD-10-CM | POA: Diagnosis not present

## 2022-09-20 DIAGNOSIS — K219 Gastro-esophageal reflux disease without esophagitis: Secondary | ICD-10-CM | POA: Diagnosis not present

## 2022-09-20 DIAGNOSIS — K2970 Gastritis, unspecified, without bleeding: Secondary | ICD-10-CM | POA: Diagnosis not present

## 2023-09-14 ENCOUNTER — Telehealth: Payer: Self-pay | Admitting: Family Medicine

## 2023-11-21 ENCOUNTER — Other Ambulatory Visit: Payer: Self-pay

## 2023-11-21 ENCOUNTER — Emergency Department
Admission: EM | Admit: 2023-11-21 | Discharge: 2023-11-21 | Disposition: A | Discharge status: Home / Self Care | Attending: Emergency Medicine | Admitting: Emergency Medicine

## 2023-11-21 DIAGNOSIS — R531 Weakness: Secondary | ICD-10-CM | POA: Diagnosis not present

## 2023-11-21 DIAGNOSIS — R197 Diarrhea, unspecified: Secondary | ICD-10-CM | POA: Diagnosis present

## 2023-11-21 DIAGNOSIS — R109 Unspecified abdominal pain: Secondary | ICD-10-CM | POA: Insufficient documentation

## 2023-11-21 LAB — CBC
HCT: 42.5 % (ref 39.0–52.0)
Hemoglobin: 14.5 g/dL (ref 13.0–17.0)
MCH: 30.1 pg (ref 26.0–34.0)
MCHC: 34.1 g/dL (ref 30.0–36.0)
MCV: 88.4 fL (ref 80.0–100.0)
Platelets: 68 K/uL — ABNORMAL LOW (ref 150–400)
RBC: 4.81 MIL/uL (ref 4.22–5.81)
RDW: 13 % (ref 11.5–15.5)
WBC: 4.1 K/uL (ref 4.0–10.5)
nRBC: 0 % (ref 0.0–0.2)

## 2023-11-21 LAB — URINALYSIS, ROUTINE W REFLEX MICROSCOPIC
Bilirubin Urine: NEGATIVE
Glucose, UA: NEGATIVE mg/dL
Hgb urine dipstick: NEGATIVE
Ketones, ur: NEGATIVE mg/dL
Leukocytes,Ua: NEGATIVE
Nitrite: NEGATIVE
Protein, ur: NEGATIVE mg/dL
Specific Gravity, Urine: 1.008 (ref 1.005–1.030)
pH: 7 (ref 5.0–8.0)

## 2023-11-21 LAB — COMPREHENSIVE METABOLIC PANEL WITH GFR
ALT: 35 U/L (ref 0–44)
AST: 43 U/L — ABNORMAL HIGH (ref 15–41)
Albumin: 4.5 g/dL (ref 3.5–5.0)
Alkaline Phosphatase: 47 U/L (ref 38–126)
Anion gap: 9 (ref 5–15)
BUN: 16 mg/dL (ref 6–20)
CO2: 26 mmol/L (ref 22–32)
Calcium: 9.4 mg/dL (ref 8.9–10.3)
Chloride: 104 mmol/L (ref 98–111)
Creatinine, Ser: 0.99 mg/dL (ref 0.61–1.24)
GFR, Estimated: >60 mL/min (ref >60)
Glucose, Bld: 115 mg/dL — ABNORMAL HIGH (ref 70–99)
Potassium: 4.1 mmol/L (ref 3.5–5.1)
Sodium: 139 mmol/L (ref 135–145)
Total Bilirubin: 0.5 mg/dL (ref 0.0–1.2)
Total Protein: 7.5 g/dL (ref 6.5–8.1)

## 2023-11-21 LAB — RESP PANEL BY RT-PCR (RSV, FLU A&B, COVID)  RVPGX2
Influenza A by PCR: NEGATIVE
Influenza B by PCR: NEGATIVE
Resp Syncytial Virus by PCR: NEGATIVE
SARS Coronavirus 2 by RT PCR: NEGATIVE

## 2023-11-21 LAB — LIPASE, BLOOD: Lipase: 39 U/L (ref 11–51)

## 2023-11-21 MED ORDER — SODIUM CHLORIDE 0.9 % IV BOLUS
1000.0000 mL | Freq: Once | INTRAVENOUS | Status: AC
  Administered 2023-11-21: 1000 mL via INTRAVENOUS

## 2023-11-21 NOTE — ED Triage Notes (Signed)
 Pt was at work, felt weak and shaky. Says he couldn't eat but no nausea. Last 4-5 days intermittent mild LUQ pain. Denies dizziness.

## 2023-11-21 NOTE — ED Provider Notes (Signed)
 [facility] Provider Note    Event Date/Time   First MD Initiated Contact with Patient 11/21/23 [phone]     (approximate)   History   Weakness   HPI  Dale Collins is a 43 y.o. male with PMH of GERD who presents for evaluation of generalized weakness.  Patient states this morning he felt very weak and shaky.  He tried eating but did not feel like he could get the food down.  Denies headache, chest pain, shortness of breath, cough, congestion, nausea, vomiting, constipation, urinary symptoms, dizziness, fevers.  Does state that he has had some pain in his left side of his abdomen and had an episode of diarrhea this morning.  States that the weakness is throughout his body and not localized to 1 side.  The pain on the left side of his abdomen has been intermittent over the past few days.      Physical Exam   Triage Vital Signs: ED Triage Vitals  Encounter Vitals Group     BP 11/21/23 0706 (!) 148/75     Girls Systolic BP Percentile --      Girls Diastolic BP Percentile --      Boys Systolic BP Percentile --      Boys Diastolic BP Percentile --      Pulse Rate 11/21/23 0706 63     Resp 11/21/23 0706 18     Temp 11/21/23 0706 98.3 F (36.8 C)     Temp Source 11/21/23 0706 Oral     SpO2 11/21/23 0706 99 %     Weight 11/21/23 0705 170 lb (77.1 kg)     Height 11/21/23 0705 5' 11 (1.803 m)     Head Circumference --      Peak Flow --      Pain Score 11/21/23 0705 3     Pain Loc --      Pain Education --      Exclude from Growth Chart --     Most recent vital signs: Vitals:   11/21/23 0706  BP: (!) 148/75  Pulse: 63  Resp: 18  Temp: 98.3 F (36.8 C)  SpO2: 99%   General: Awake, no distress.  CV:  Good peripheral perfusion.  RRR. Resp:  Normal effort.  CTAB. Abd:  No distention.  Soft, nontender to palpation. Other:     ED Results / Procedures / Treatments   Labs (all labs ordered are listed, but only abnormal results are  displayed) Labs Reviewed  COMPREHENSIVE METABOLIC PANEL WITH GFR - Abnormal; Notable for the following components:      Result Value   Glucose, Bld 115 (*)    AST 43 (*)    All other components within normal limits  CBC - Abnormal; Notable for the following components:   Platelets 68 (*)    All other components within normal limits  URINALYSIS, ROUTINE W REFLEX MICROSCOPIC - Abnormal; Notable for the following components:   Color, Urine STRAW (*)    APPearance CLEAR (*)    All other components within normal limits  RESP PANEL BY RT-PCR (RSV, FLU A&B, COVID)  [id number]  LIPASE, BLOOD      PROCEDURES:  Critical Care performed: No  Procedures   MEDICATIONS ORDERED IN ED: Medications  sodium chloride  0.9 % bolus 1,000 mL (1,000 mLs Intravenous New Bag/Given 11/21/23 0745)     IMPRESSION / MDM / ASSESSMENT AND PLAN / ED COURSE  I reviewed the triage vital signs and the  nursing notes.                             43 year old male presents for evaluation of generalized weakness.  Blood pressure is a little bit elevated otherwise vital signs are stable.  Patient NAD and well-appearing on exam.  Differential diagnosis includes, but is not limited to, hypoglycemia, electrolyte abnormality, anemia, viral infection, dehydration, UTI, gastroenteritis.  Patient's presentation is most consistent with acute complicated illness / injury requiring diagnostic workup.  Will obtain labs, respiratory panel and urinalysis.  Physical exam is overall reassuring.  Patient did not have any tenderness to palpation of the abdomen.  If labs are reassuring we will plan to hold off on imaging at this time.  Do not suspect stroke as cause given patient's weakness is generalized and not localized to 1 side.  Sounds more consistent with a viral infection.  Workup is overall reassuring.  Did give patient some IV fluids as he may have been a little dehydrated.  Patient reports he is feeling better after the  fluids.  Suspect patient may have a viral infection.  Patient thinks it may be due to something he ate last night.  Will give him a note for work today.  Did discuss return precautions.  Patient voiced understanding, all questions were answered and he is stable at discharge.  Clinical Course as of 11/21/23 0855  Mon Nov 21, 2023  0752 CBC(!) Unremarkable aside from low platelets. [LD]  [id number] Comprehensive metabolic panel(!) Unremarkable, no hypoglycemia. [LD]  [id number] Urinalysis, Routine w reflex microscopic -Urine, Clean Catch(!) Within normal limits, no signs of infection. [LD]  0805 Resp panel by RT-PCR (RSV, Flu A&B, Covid) Anterior Nasal Swab Negative. [LD]    Clinical Course User Index [LD] Cleaster Tinnie LABOR, PA-C     FINAL CLINICAL IMPRESSION(S) / ED DIAGNOSES   Final diagnoses:  Generalized weakness     Rx / DC Orders   ED Discharge Orders     None        Note:  This document was prepared using Dragon voice recognition software and may include unintentional dictation errors.   Cleaster Tinnie LABOR, PA-C 11/21/23 9143    Jossie Artist POUR, MD 11/23/23 1134

## 2023-11-21 NOTE — ED Notes (Signed)
States he is feeling a little better.  

## 2023-11-21 NOTE — Discharge Instructions (Signed)
 Your blood work and urinalysis were reassuring.  You tested negative for flu, COVID and RSV.  Please make sure you get lots of rest and drink lots of fluids today.  Return to the emergency department with any worsening symptoms.

## 2023-11-21 NOTE — ED Notes (Signed)
 See triage note  States he just doesn't feel well  Unsure of fever  Also has had some left sided abd pain  States pain has been off and on
